# Patient Record
Sex: Male | Born: 1980 | Race: Black or African American | Hispanic: No | Marital: Married
Health system: Southern US, Community
[De-identification: ages and names within clinical notes are randomized; demographics above are authoritative.]

---
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
## 2008-05-07 ENCOUNTER — Emergency Department (HOSPITAL_COMMUNITY): Admission: EM | Admit: 2008-05-07 | Discharge: 2008-05-07 | Payer: Self-pay | Admitting: Emergency Medicine
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
## 2022-05-05 ENCOUNTER — Emergency Department (HOSPITAL_COMMUNITY)
Admission: EM | Admit: 2022-05-05 | Discharge: 2022-05-05 | Disposition: A | Discharge status: Home / Self Care | Payer: No Typology Code available for payment source | Attending: Emergency Medicine | Admitting: Emergency Medicine

## 2022-05-05 DIAGNOSIS — Y92410 Unspecified street and highway as the place of occurrence of the external cause: Secondary | ICD-10-CM | POA: Insufficient documentation

## 2022-05-05 DIAGNOSIS — S39012A Strain of muscle, fascia and tendon of lower back, initial encounter: Secondary | ICD-10-CM | POA: Diagnosis not present

## 2022-05-05 DIAGNOSIS — M549 Dorsalgia, unspecified: Secondary | ICD-10-CM | POA: Diagnosis present

## 2022-05-05 MED ORDER — IBUPROFEN 600 MG PO TABS: 600.0000 mg | ORAL_TABLET | Freq: Three times a day (TID) | ORAL | 0 refills | Status: AC | PRN

## 2022-05-05 MED ORDER — IBUPROFEN 600 MG PO TABS: 600.0000 mg | ORAL_TABLET | Freq: Three times a day (TID) | ORAL | 0 refills | Status: DC | PRN

## 2022-05-05 MED ORDER — CYCLOBENZAPRINE HCL 10 MG PO TABS: 10.0000 mg | ORAL_TABLET | Freq: Two times a day (BID) | ORAL | 0 refills | Status: AC | PRN

## 2022-05-05 MED ORDER — CYCLOBENZAPRINE HCL 10 MG PO TABS: 10.0000 mg | ORAL_TABLET | Freq: Two times a day (BID) | ORAL | 0 refills | Status: DC | PRN

## 2022-05-05 NOTE — Discharge Instructions (Signed)
Thank you so much for letting us take care of you today.   Your exam and history was reassuring.  I am starting you on muscle relaxers and NSAIDs to help with your back pain.  Please take these as prescribed over the next few days to help get better control of your back pain.  You may also take over-the-counter Tylenol to help with this.  I do believe that you would benefit from establishing a primary care.  I have provided 2 clinics that you may call to set up an appointment.  This is important to manage chronic conditions and other issues that may arise that can affect your health.  I provided some exercises that may help rehabilitate your back.  Please do these as tolerated.  If you develop any new or worsening symptoms such as as we discussed, please return to the nearest emergency department for reevaluation.
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
